# Patient Record
Sex: Female | Born: 1965 | Race: White | Hispanic: No | Marital: Married | Smoking: Current every day smoker
Health system: Southern US, Community
[De-identification: ages and names within clinical notes are randomized; demographics above are authoritative.]

## PROBLEM LIST (undated history)

## (undated) DIAGNOSIS — F419 Anxiety disorder, unspecified: Secondary | ICD-10-CM

## (undated) DIAGNOSIS — M2550 Pain in unspecified joint: Secondary | ICD-10-CM

## (undated) DIAGNOSIS — K257 Chronic gastric ulcer without hemorrhage or perforation: Secondary | ICD-10-CM

## (undated) DIAGNOSIS — R51 Headache: Secondary | ICD-10-CM

## (undated) DIAGNOSIS — F32A Depression, unspecified: Secondary | ICD-10-CM

## (undated) DIAGNOSIS — F329 Major depressive disorder, single episode, unspecified: Secondary | ICD-10-CM

## (undated) HISTORY — DX: Depression, unspecified: F32.A

## (undated) HISTORY — DX: Pain in unspecified joint: M25.50

## (undated) HISTORY — DX: Chronic gastric ulcer without hemorrhage or perforation: K25.7

## (undated) HISTORY — DX: Major depressive disorder, single episode, unspecified: F32.9

## (undated) HISTORY — DX: Headache: R51

## (undated) HISTORY — DX: Anxiety disorder, unspecified: F41.9

---

## 2006-10-08 DIAGNOSIS — M2550 Pain in unspecified joint: Secondary | ICD-10-CM

## 2006-10-08 HISTORY — DX: Pain in unspecified joint: M25.50

## 2010-08-08 DIAGNOSIS — K257 Chronic gastric ulcer without hemorrhage or perforation: Secondary | ICD-10-CM

## 2010-08-08 HISTORY — DX: Chronic gastric ulcer without hemorrhage or perforation: K25.7

## 2011-08-17 ENCOUNTER — Ambulatory Visit (INDEPENDENT_AMBULATORY_CARE_PROVIDER_SITE_OTHER): Payer: BC Managed Care – PPO | Admitting: Psychiatry

## 2011-08-17 ENCOUNTER — Encounter (HOSPITAL_COMMUNITY): Payer: Self-pay | Admitting: Psychiatry

## 2011-08-17 VITALS — BP 123/90 | HR 83 | Ht 69.0 in | Wt 234.0 lb

## 2011-08-17 DIAGNOSIS — G4700 Insomnia, unspecified: Secondary | ICD-10-CM

## 2011-08-17 DIAGNOSIS — Z6379 Other stressful life events affecting family and household: Secondary | ICD-10-CM

## 2011-08-17 DIAGNOSIS — F489 Nonpsychotic mental disorder, unspecified: Secondary | ICD-10-CM

## 2011-08-17 DIAGNOSIS — F411 Generalized anxiety disorder: Secondary | ICD-10-CM

## 2011-08-17 DIAGNOSIS — F419 Anxiety disorder, unspecified: Secondary | ICD-10-CM

## 2011-08-17 DIAGNOSIS — Z634 Disappearance and death of family member: Secondary | ICD-10-CM

## 2011-08-17 DIAGNOSIS — F5105 Insomnia due to other mental disorder: Secondary | ICD-10-CM | POA: Insufficient documentation

## 2011-08-17 DIAGNOSIS — F418 Other specified anxiety disorders: Secondary | ICD-10-CM | POA: Insufficient documentation

## 2011-08-17 DIAGNOSIS — Z636 Dependent relative needing care at home: Secondary | ICD-10-CM | POA: Insufficient documentation

## 2011-08-17 DIAGNOSIS — F341 Dysthymic disorder: Secondary | ICD-10-CM

## 2011-08-17 DIAGNOSIS — Z762 Encounter for health supervision and care of other healthy infant and child: Secondary | ICD-10-CM | POA: Insufficient documentation

## 2011-08-17 MED ORDER — CLONAZEPAM 0.5 MG PO TABS: 0.5000 mg | ORAL_TABLET | Freq: Every day | ORAL | Status: AC

## 2011-08-17 MED ORDER — CLONAZEPAM 1 MG PO TABS
1.0000 mg | ORAL_TABLET | Freq: Two times a day (BID) | ORAL | Status: DC
Start: 2011-08-27 — End: 2020-01-03

## 2011-08-17 MED ORDER — DULOXETINE HCL 30 MG PO CPEP: 30.0000 mg | ORAL_CAPSULE | Freq: Every day | ORAL | Status: DC

## 2011-08-17 NOTE — Progress Notes (Signed)
Debbie Burke is a 45 y.o. CF who is married.  Her husband is Hessie Diener, age 63 works at [employer] as Pensions consultant.,  Son Maurine Minister lives at home, works at [employer].  Daughter Noreene Larsson, unmarried with son Apolinar Junes age 75 not working.and lives at home  She worked until 2007 and lost her job.  She has osteoarthritis and a gastric ulcer.  She has taken care of her parents for the past 5 years.  Her mother just died of Alzheimer's Disease at age 49, July 2012.  Her father Maurine Minister age 752 is widowed with Hx of MI, CABG, COPD [w/o oxygen supplement] and prostate cancer  treated with radiation.  She takes care of him daily.  She is in great morning over mother's death.  She complains of grief depression and great anxiety.  She says everything falls on her shoulders and no one expresses any appreciation.  She says no one ever told her she was 'wrong' until she was married.  She is so overwhelmed that at times she goes to her mother's grave and lies down. She denies any anger outbursts, physical abuse or sexual abuse.  She denies Suicidal ideation. And has passive thoughts of wanting to be with her mother - because she misses her but not to plan to kill herself.  She is given a Crisis numbers card. She is depressed and anxious.  She asks repeatedly for more benzodiazepine.  She accepts a 0.5 mg to add to her 1 mg BID dose of clonazepam.   She is invited to consider therapy and she is hesitant.  She is asked to meet with KL at least once.  Benefits of grief therapy and anxiety reduction are presented as benefits.  She agrees to meet with KL at least  Once.  Her affect is mood congruent.  She has an orgranized logical presentation  She denies auditory/visual hallucinations.  Her cognition is intact.  She has poor insight and feels victimized.  She is also dedicated to the care of her father whom she sees daily.  Her judgment is fair. Axis I  Depression with anxiety, secondary insomnia Axis II  Dependent Personality Axis III Osteoarthritis,  headaches, gastric ulcer Axis IV family stressors, death of parent, perpetual caregiver Axis  V GAF 55

## 2011-08-17 NOTE — Patient Instructions (Addendum)
Take medications as written.  When current medication  'acid reducer' is taken   Find equivalent OTC ranitadine  Dose equivalent ADD 0.5 MG kLONOPIN TO YOUR REGIMEN  Schedule appt with KL Use Crisis numbers if needed, Card is given  Return 1 month

## 2011-09-06 ENCOUNTER — Ambulatory Visit (HOSPITAL_COMMUNITY): Payer: BC Managed Care – PPO | Admitting: Psychology

## 2011-09-17 ENCOUNTER — Ambulatory Visit (HOSPITAL_COMMUNITY): Payer: BC Managed Care – PPO | Admitting: Psychiatry

## 2011-11-02 ENCOUNTER — Other Ambulatory Visit (HOSPITAL_COMMUNITY): Payer: Self-pay | Admitting: Psychiatry

## 2011-11-02 NOTE — Progress Notes (Signed)
[facility] sent fax for prior authorization.  Multiple calls incl [employer] to confirm there is not PA required  copay is ~ $82.00

## 2012-02-29 ENCOUNTER — Encounter (HOSPITAL_COMMUNITY): Payer: Self-pay | Admitting: Psychiatry

## 2018-04-18 ENCOUNTER — Ambulatory Visit (HOSPITAL_COMMUNITY): Payer: Self-pay | Admitting: Psychiatry

## 2018-06-10 ENCOUNTER — Ambulatory Visit (HOSPITAL_COMMUNITY): Payer: Self-pay | Admitting: Psychiatry

## 2020-01-03 ENCOUNTER — Other Ambulatory Visit: Payer: Self-pay

## 2020-01-03 ENCOUNTER — Emergency Department
Admission: EM | Admit: 2020-01-03 | Discharge: 2020-01-03 | Disposition: A | Discharge status: Home / Self Care | Payer: 59 | Source: Home / Self Care | Attending: Family Medicine | Admitting: Family Medicine

## 2020-01-03 ENCOUNTER — Emergency Department (INDEPENDENT_AMBULATORY_CARE_PROVIDER_SITE_OTHER): Payer: 59

## 2020-01-03 DIAGNOSIS — M1712 Unilateral primary osteoarthritis, left knee: Secondary | ICD-10-CM | POA: Diagnosis not present

## 2020-01-03 DIAGNOSIS — M25562 Pain in left knee: Secondary | ICD-10-CM | POA: Diagnosis not present

## 2020-01-03 NOTE — ED Triage Notes (Signed)
Pt c/o LT knee pain since this morning. Was at work yesterday and it was slightly sore, but woke up today with severe pain. 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

## 2020-01-03 NOTE — ED Provider Notes (Signed)
[facility]    CSN: [id number] Arrival date & time: 01/03/20  1027      History   Chief Complaint Chief Complaint  Patient presents with  . Knee Pain    LT    HPI Debbie Burke is a 54 y.o. female.   Patient awoke with pain/swelling in her left knee this morning, and now has difficulty bearing weight on her left knee. She has a history of chronic bilateral lower leg and knee pain, followed by the [facility], but recalls no recent injury or change in physical activities.  The history is provided by the patient.  Knee Pain Location:  Knee Time since incident:  7 hours Injury: no   Knee location:  L knee Pain details:    Quality:  Aching   Radiates to:  Does not radiate   Severity:  Severe   Onset quality:  Sudden   Duration:  7 hours   Timing:  Constant   Progression:  Unchanged Chronicity:  New Prior injury to area:  No Relieved by:  Nothing Worsened by:  Bearing weight Ineffective treatments:  None tried Associated symptoms: decreased ROM, stiffness and swelling   Associated symptoms: no numbness and no tingling   Risk factors: obesity     Past Medical History:  Diagnosis Date  . Anxiety   . Arthralgia of multiple joints 2008   steoarthritis  . Depression   . Gastric ulcer, chronic November 2011   endoscopy performed  patient takes OTC  acid reducer  . [id number](478.2)     Patient Active Problem List   Diagnosis Date Noted  . Anxiety 08/17/2011  . Depression with anxiety 08/17/2011  . Bereavement 08/17/2011  . Insomnia related to another mental disorder 08/17/2011  . Caregiver burden 08/17/2011  . Child in care 08/17/2011    History reviewed. No pertinent surgical history.  OB History   No obstetric history on file.      Home Medications    Prior to Admission medications   Medication Sig Start Date End Date Taking? Authorizing Provider  gabapentin (NEURONTIN) 300 MG capsule Take 300 mg by mouth 4 (four) times  daily. 2 tabs 4x a day   Yes [provider]  hydrOXYzine (ATARAX/VISTARIL) 25 MG tablet Take 25 mg by mouth 3 (three) times daily as needed.   Yes [provider]  omeprazole (PRILOSEC) 20 MG capsule Take 20 mg by mouth daily.   Yes [provider]  tiZANidine (ZANAFLEX) 4 MG capsule Take 4 mg by mouth 3 (three) times daily. 1 qam, 1 at lunch and 3 qhs   Yes [provider]  buPROPion (WELLBUTRIN XL) 150 MG 24 hr tablet Take 300 mg by mouth daily.  05/17/11   [provider]  clonazePAM (KLONOPIN) 0.5 MG tablet Take 1 tablet (0.5 mg total) by mouth daily. 08/17/11 09/16/11  Mickeal Skinner, MD  DULoxetine (CYMBALTA) 30 MG capsule Take 1 capsule (30 mg total) by mouth daily. Patient taking differently: Take 60 mg by mouth daily.  08/17/11 08/16/12  Mickeal Skinner, MD  HYDROcodone-acetaminophen (VICODIN) 5-500 MG per tablet Take 5 tablets by mouth 3 (three) times daily. Actual dose is 5 mg-50 given by PCP 07/17/11   [provider]    Family History Family History  Problem Relation Age of Onset  . Depression Father   . Dementia Father   . Bipolar disorder Daughter     Social History Social History   Tobacco Use  . Smoking  status: Current Every Day Smoker    Packs/day: 1.00    Types: Cigarettes  . Smokeless tobacco: Never Used  . Tobacco comment: has Chantix Rx and not using it  Substance Use Topics  . Alcohol use: No  . Drug use: No     Allergies   Patient has no known allergies.   Review of Systems Review of Systems  Constitutional: Negative.   HENT: Negative.   Eyes: Negative.   Respiratory: Negative for chest tightness, shortness of breath, wheezing and stridor.   Cardiovascular: Negative for chest pain.  Gastrointestinal: Negative.   Genitourinary: Negative.   Musculoskeletal: Positive for arthralgias, gait problem, joint swelling and stiffness.  Skin: Negative.      Physical Exam Triage Vital Signs ED Triage  Vitals  Enc Vitals Group     BP 01/03/20 1043 108/71     Pulse Rate 01/03/20 1043 75     Resp --      Temp 01/03/20 1043 98 F (36.7 C)     Temp Source 01/03/20 1043 Oral     SpO2 01/03/20 1043 96 %     Weight --      Height --      Head Circumference --      Peak Flow --      Pain Score 01/03/20 1046 7     Pain Loc --      Pain Edu? --      Excl. in GC? --    No data found.  Updated Vital Signs BP 108/71 (BP Location: Left Arm)   Pulse 75   Temp 98 F (36.7 C) (Oral)   LMP 10/16/2010   SpO2 96%   Visual Acuity Right Eye Distance:   Left Eye Distance:   Bilateral Distance:    Right Eye Near:   Left Eye Near:    Bilateral Near:     Physical Exam Vitals and nursing note reviewed.  Constitutional:      General: She is not in acute distress. HENT:     Head: Normocephalic.  Eyes:     Pupils: Pupils are equal, round, and reactive to light.  Cardiovascular:     Rate and Rhythm: Normal rate.  Pulmonary:     Effort: Pulmonary effort is normal.  Musculoskeletal:     Cervical back: Normal range of motion.     Left knee: Bony tenderness present. No swelling, erythema, ecchymosis or crepitus. Decreased range of motion. Tenderness present over the lateral joint line. No ACL laxity or PCL laxity.Normal meniscus.       Legs:     Comments: Left knee:  Patient has difficulty actively or passively flexing beyond 90 degrees.  Tenderness over lateral joint line and lateral edge of patella. No calf or thigh tenderness.  Skin:    General: Skin is warm and dry.  Neurological:     Mental Status: She is alert.      UC Treatments / Results  Labs (all labs ordered are listed, but only abnormal results are displayed) Labs Reviewed - No data to display  EKG   Radiology DG Knee Complete 4 Views Left  Result Date: 01/03/2020 CLINICAL DATA:  Increasing left knee pain. EXAM: LEFT KNEE - COMPLETE 4+ VIEW COMPARISON:  None. FINDINGS: No acute fracture or dislocation.  Generalized osteopenia. No aggressive osseous lesion. Trace joint effusion. Mild tricompartmental osteoarthritis of the left knee most severe in the medial femorotibial compartment and patellofemoral compartment. IMPRESSION: No acute osseous injury of the left  knee. Mild tricompartmental osteoarthritis of the left knee. Electronically Signed   By: Kathreen Devoid   On: 01/03/2020 11:53    Procedures Procedures (including critical care time)  Medications Ordered in UC Medications - No data to display  Initial Impression / Assessment and Plan / UC Course  I have reviewed the triage vital signs and the nursing notes.  Pertinent labs & imaging results that were available during my care of the patient were reviewed by me and considered in my medical decision making (see chart for details).    Dispensed knee brace. Followup with Dr. Aundria Mems ([facility]) for further evaluation/treatment.  Final Clinical Impressions(s) / UC Diagnoses   Final diagnoses:  Acute pain of left knee  Primary osteoarthritis of left knee     Discharge Instructions     Wear knee brace.  Use cane for support.  Apply ice pack for 20 to 30 minutes, 3 to 4 times daily  Continue until pain and swelling decrease.  May take Ibuprofen 200mg , 4 tabs every 8 hours with food.     ED Prescriptions    None        Kandra Nicolas, MD 01/03/20 1214

## 2020-01-03 NOTE — Discharge Instructions (Addendum)
Wear knee brace.  Use cane for support.  Apply ice pack for 20 to 30 minutes, 3 to 4 times daily  Continue until pain and swelling decrease.  May take Ibuprofen 200mg , 4 tabs every 8 hours with food.

## 2021-09-25 IMAGING — DX DG KNEE COMPLETE 4+V*L*
4 series · 4 of 4 positions shown · non-contrast
Comparison: None.

CLINICAL DATA: Increasing left knee pain.

EXAM:
LEFT KNEE - COMPLETE 4+ VIEW

[knee ap]
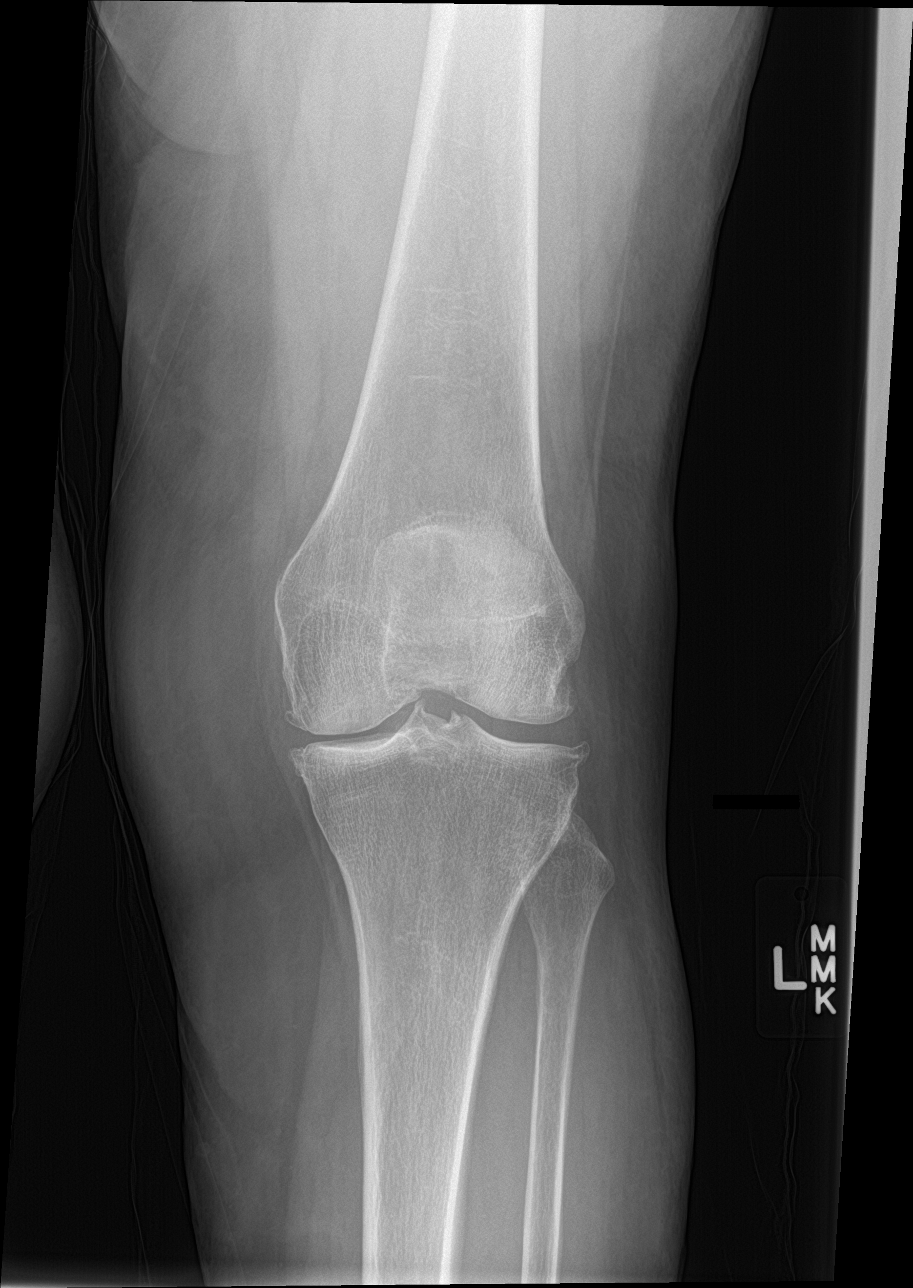

[tunnel]
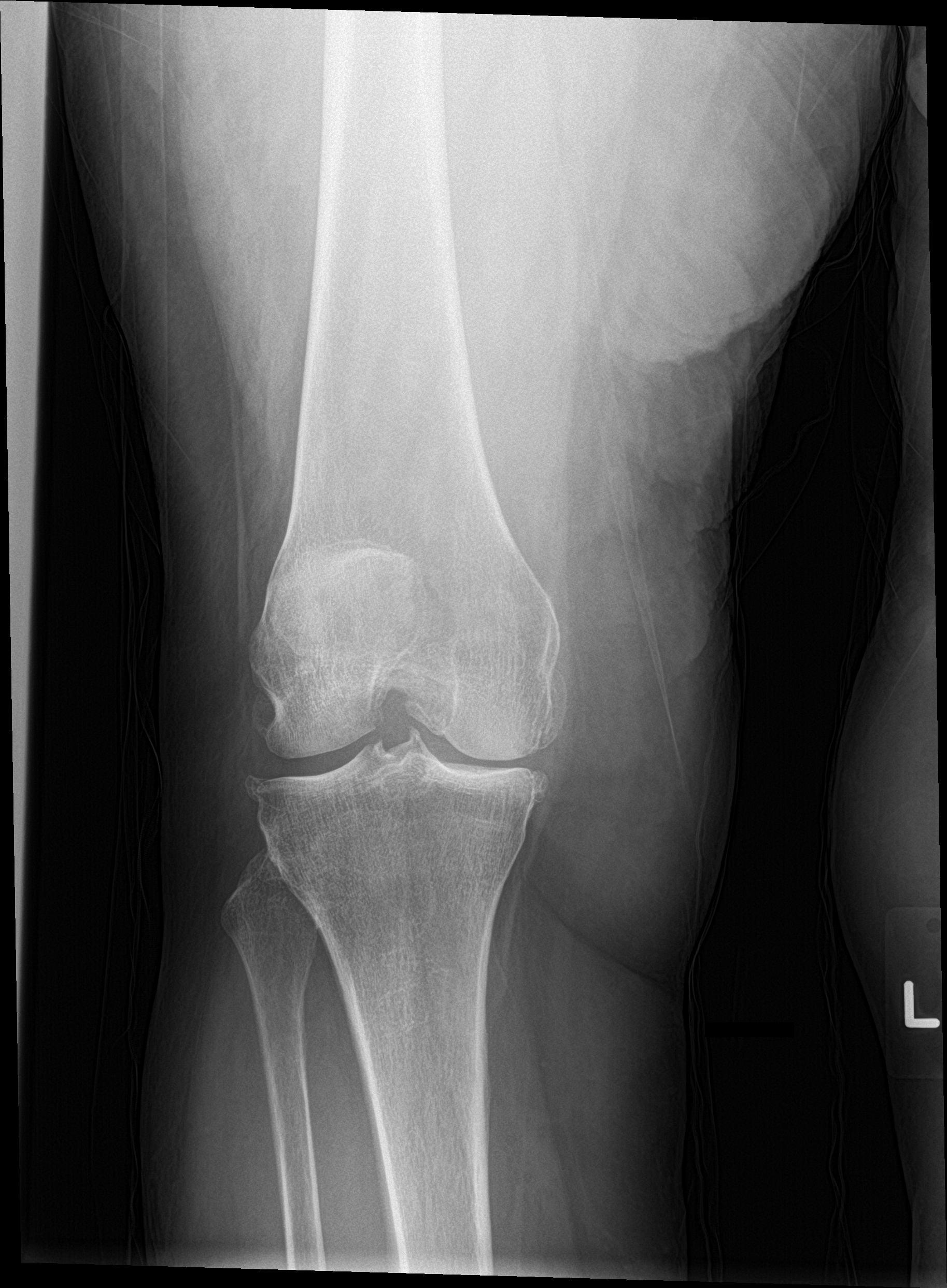

[knee lat]
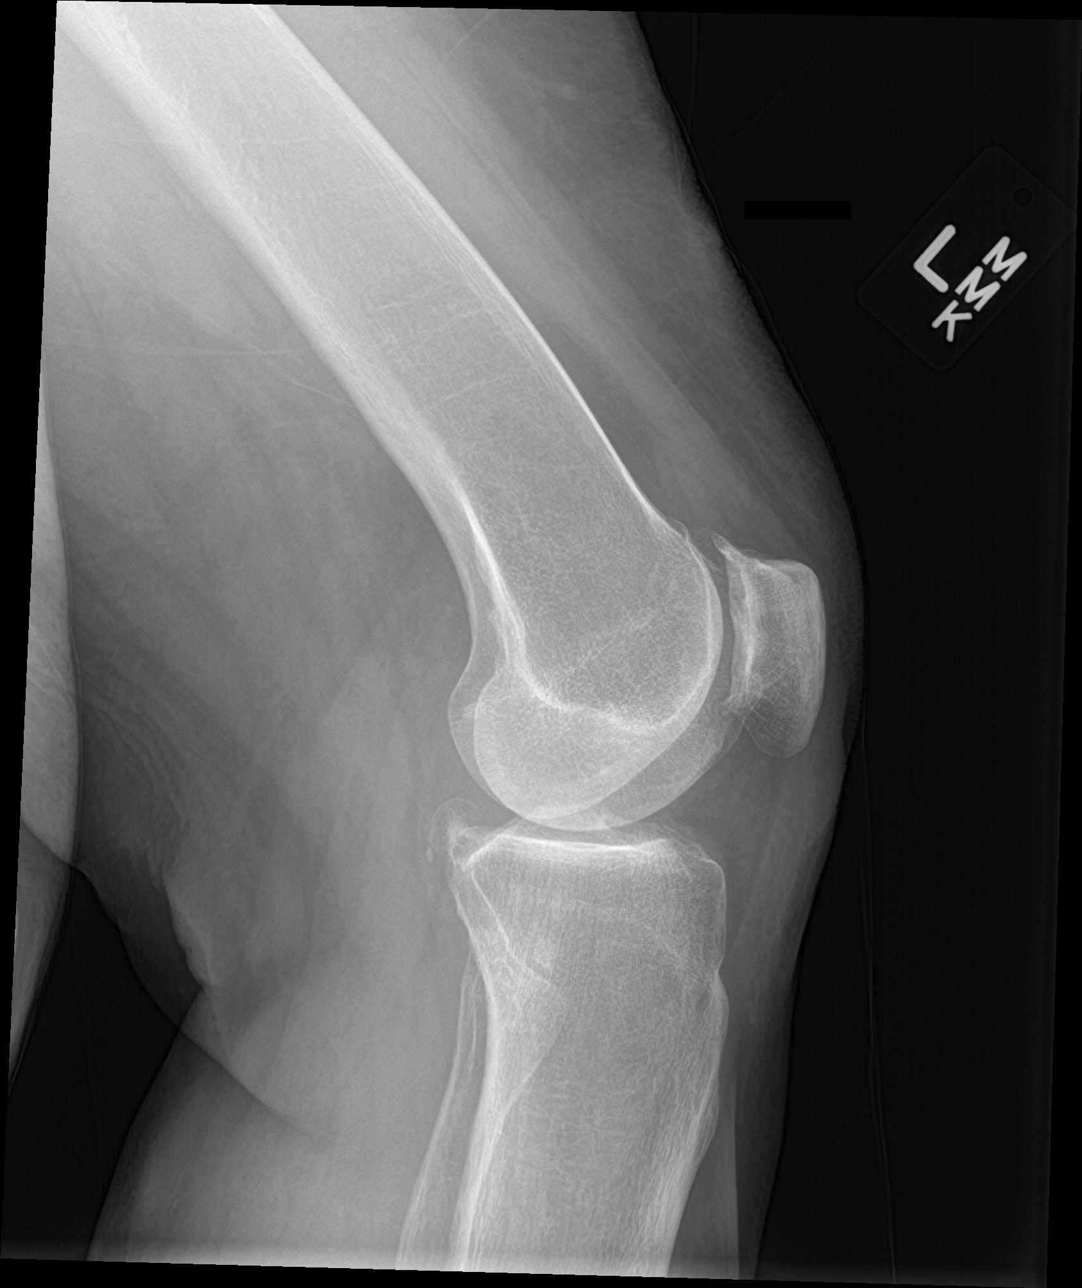

[knee sunrise]
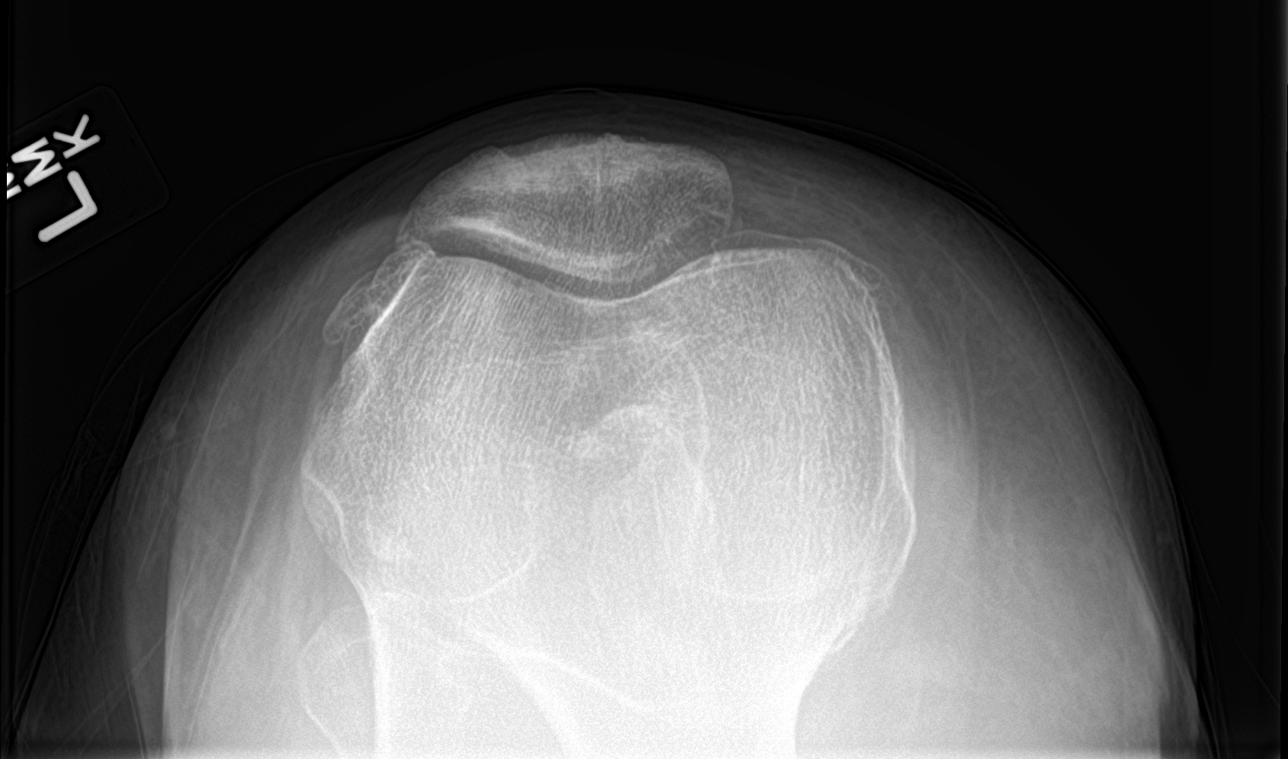

[4 of 4 positions shown; findings below may reference images not displayed]

FINDINGS: No acute fracture or dislocation. Generalized osteopenia. No
aggressive osseous lesion. Trace joint effusion. Mild
tricompartmental osteoarthritis of the left knee most severe in the
medial femorotibial compartment and patellofemoral compartment.
IMPRESSION: No acute osseous injury of the left knee.

Mild tricompartmental osteoarthritis of the left knee.

## 2022-09-11 DIAGNOSIS — Z23 Encounter for immunization: Secondary | ICD-10-CM | POA: Diagnosis not present

## 2022-09-11 DIAGNOSIS — Z6831 Body mass index (BMI) 31.0-31.9, adult: Secondary | ICD-10-CM | POA: Diagnosis not present

## 2022-09-11 DIAGNOSIS — J0100 Acute maxillary sinusitis, unspecified: Secondary | ICD-10-CM | POA: Diagnosis not present

## 2022-09-11 DIAGNOSIS — Z0000 Encounter for general adult medical examination without abnormal findings: Secondary | ICD-10-CM | POA: Diagnosis not present

## 2022-09-18 DIAGNOSIS — T402X5A Adverse effect of other opioids, initial encounter: Secondary | ICD-10-CM | POA: Diagnosis not present

## 2022-09-18 DIAGNOSIS — K5903 Drug induced constipation: Secondary | ICD-10-CM | POA: Diagnosis not present

## 2022-09-18 DIAGNOSIS — Z1211 Encounter for screening for malignant neoplasm of colon: Secondary | ICD-10-CM | POA: Diagnosis not present

## 2022-09-18 DIAGNOSIS — K315 Obstruction of duodenum: Secondary | ICD-10-CM | POA: Diagnosis not present

## 2022-09-18 DIAGNOSIS — R1013 Epigastric pain: Secondary | ICD-10-CM | POA: Diagnosis not present

## 2022-09-18 DIAGNOSIS — Z9049 Acquired absence of other specified parts of digestive tract: Secondary | ICD-10-CM | POA: Diagnosis not present

## 2022-09-18 DIAGNOSIS — R112 Nausea with vomiting, unspecified: Secondary | ICD-10-CM | POA: Diagnosis not present

## 2022-09-18 DIAGNOSIS — K5900 Constipation, unspecified: Secondary | ICD-10-CM | POA: Diagnosis not present

## 2022-11-29 DIAGNOSIS — J0101 Acute recurrent maxillary sinusitis: Secondary | ICD-10-CM | POA: Diagnosis not present

## 2022-12-08 DIAGNOSIS — R051 Acute cough: Secondary | ICD-10-CM | POA: Diagnosis not present

## 2022-12-08 DIAGNOSIS — R918 Other nonspecific abnormal finding of lung field: Secondary | ICD-10-CM | POA: Diagnosis not present

## 2022-12-08 DIAGNOSIS — R059 Cough, unspecified: Secondary | ICD-10-CM | POA: Diagnosis not present

## 2022-12-12 DIAGNOSIS — M5440 Lumbago with sciatica, unspecified side: Secondary | ICD-10-CM | POA: Diagnosis not present

## 2022-12-12 DIAGNOSIS — M25562 Pain in left knee: Secondary | ICD-10-CM | POA: Diagnosis not present

## 2022-12-12 DIAGNOSIS — G609 Hereditary and idiopathic neuropathy, unspecified: Secondary | ICD-10-CM | POA: Diagnosis not present

## 2022-12-12 DIAGNOSIS — G8929 Other chronic pain: Secondary | ICD-10-CM | POA: Diagnosis not present

## 2022-12-12 DIAGNOSIS — M797 Fibromyalgia: Secondary | ICD-10-CM | POA: Diagnosis not present

## 2022-12-12 DIAGNOSIS — M25561 Pain in right knee: Secondary | ICD-10-CM | POA: Diagnosis not present

## 2023-02-04 DIAGNOSIS — J479 Bronchiectasis, uncomplicated: Secondary | ICD-10-CM | POA: Diagnosis not present

## 2023-02-12 DIAGNOSIS — G43C1 Periodic headache syndromes in child or adult, intractable: Secondary | ICD-10-CM | POA: Diagnosis not present

## 2023-02-12 DIAGNOSIS — F419 Anxiety disorder, unspecified: Secondary | ICD-10-CM | POA: Diagnosis not present

## 2023-02-12 DIAGNOSIS — J8410 Pulmonary fibrosis, unspecified: Secondary | ICD-10-CM | POA: Diagnosis not present

## 2023-02-12 DIAGNOSIS — J432 Centrilobular emphysema: Secondary | ICD-10-CM | POA: Diagnosis not present

## 2023-02-12 DIAGNOSIS — J189 Pneumonia, unspecified organism: Secondary | ICD-10-CM | POA: Diagnosis not present

## 2023-02-12 DIAGNOSIS — F3340 Major depressive disorder, recurrent, in remission, unspecified: Secondary | ICD-10-CM | POA: Diagnosis not present

## 2023-02-26 DIAGNOSIS — M797 Fibromyalgia: Secondary | ICD-10-CM | POA: Diagnosis not present

## 2023-02-26 DIAGNOSIS — Z5181 Encounter for therapeutic drug level monitoring: Secondary | ICD-10-CM | POA: Diagnosis not present

## 2023-02-26 DIAGNOSIS — Z79899 Other long term (current) drug therapy: Secondary | ICD-10-CM | POA: Diagnosis not present

## 2023-02-26 DIAGNOSIS — M5440 Lumbago with sciatica, unspecified side: Secondary | ICD-10-CM | POA: Diagnosis not present

## 2023-02-26 DIAGNOSIS — G609 Hereditary and idiopathic neuropathy, unspecified: Secondary | ICD-10-CM | POA: Diagnosis not present

## 2023-02-26 DIAGNOSIS — M25561 Pain in right knee: Secondary | ICD-10-CM | POA: Diagnosis not present

## 2023-02-26 DIAGNOSIS — M25562 Pain in left knee: Secondary | ICD-10-CM | POA: Diagnosis not present

## 2023-02-26 DIAGNOSIS — G8929 Other chronic pain: Secondary | ICD-10-CM | POA: Diagnosis not present

## 2023-04-01 DIAGNOSIS — R911 Solitary pulmonary nodule: Secondary | ICD-10-CM | POA: Diagnosis not present

## 2023-04-01 DIAGNOSIS — J8410 Pulmonary fibrosis, unspecified: Secondary | ICD-10-CM | POA: Diagnosis not present

## 2023-04-01 DIAGNOSIS — J479 Bronchiectasis, uncomplicated: Secondary | ICD-10-CM | POA: Diagnosis not present

## 2023-04-01 DIAGNOSIS — I2510 Atherosclerotic heart disease of native coronary artery without angina pectoris: Secondary | ICD-10-CM | POA: Diagnosis not present

## 2023-04-14 ENCOUNTER — Ambulatory Visit
Admission: EM | Admit: 2023-04-14 | Discharge: 2023-04-14 | Disposition: A | Discharge status: Home / Self Care | Payer: Medicaid Other | Attending: Family Medicine | Admitting: Family Medicine

## 2023-04-14 ENCOUNTER — Other Ambulatory Visit: Payer: Self-pay

## 2023-04-14 ENCOUNTER — Encounter: Payer: Self-pay | Admitting: Emergency Medicine

## 2023-04-14 ENCOUNTER — Ambulatory Visit (INDEPENDENT_AMBULATORY_CARE_PROVIDER_SITE_OTHER): Payer: Medicaid Other

## 2023-04-14 DIAGNOSIS — R509 Fever, unspecified: Secondary | ICD-10-CM | POA: Diagnosis not present

## 2023-04-14 DIAGNOSIS — R058 Other specified cough: Secondary | ICD-10-CM

## 2023-04-14 DIAGNOSIS — Z8709 Personal history of other diseases of the respiratory system: Secondary | ICD-10-CM

## 2023-04-14 DIAGNOSIS — J209 Acute bronchitis, unspecified: Secondary | ICD-10-CM

## 2023-04-14 DIAGNOSIS — R0989 Other specified symptoms and signs involving the circulatory and respiratory systems: Secondary | ICD-10-CM | POA: Diagnosis not present

## 2023-04-14 DIAGNOSIS — R059 Cough, unspecified: Secondary | ICD-10-CM | POA: Diagnosis not present

## 2023-04-14 MED ORDER — AMOXICILLIN-POT CLAVULANATE 875-125 MG PO TABS: 1.0000 | ORAL_TABLET | Freq: Two times a day (BID) | ORAL | 0 refills | Status: DC

## 2023-04-14 MED ORDER — PREDNISONE 20 MG PO TABS: 40.0000 mg | ORAL_TABLET | Freq: Every day | ORAL | 0 refills | Status: DC

## 2023-04-14 NOTE — Discharge Instructions (Signed)
Drink lots of water Take the antibiotic 2 x a day Take with food Tale the prednisone once a day Use a humidifier int he bedroom if one is available Continue inhalers Call pulmonary doctor if not improving in a couple of days

## 2023-04-14 NOTE — ED Triage Notes (Signed)
Pt here for nasal/chest congestion and headache over frontal sinuses.  + yellow sputum with cough.  Pt reports temp 100 last night.  C/o pain over maxillary sinuses as well and states "I just don't feel good".  Did home covid test yesterday that was negative.  Pt reports recent pulmonary fibrosis dx.  Feels like has wheezing with exertion.

## 2023-04-14 NOTE — ED Provider Notes (Signed)
[facility]    CSN: [id number] Arrival date & time: 04/14/23  1004      History   Chief Complaint Chief Complaint  Patient presents with   Cough   Nasal Congestion         HPI Debbie Burke is a 57 y.o. female.   HPI Patient has an upper respiratory infection with cough and chest congestion has been going on for the last 3 to 4 days.  Her temp was 100 last night.  She does have underlying pulmonary fibrosis.  She states that she did a COVID test at home that was negative.  She states that she feels like she is wheezing.  She feels short of breath.  She is using her inhalers.  She states that when she gets respiratory infections she usually sees her pulmonary doctors.  They are not available today on Sunday  Past Medical History:  Diagnosis Date   Anxiety    Arthralgia of multiple joints 2008   steoarthritis   Depression    Gastric ulcer, chronic November 2011   endoscopy performed  patient takes OTC  acid reducer   Headache(784.0)     Patient Active Problem List   Diagnosis Date Noted   Anxiety 08/17/2011   Depression with anxiety 08/17/2011   Bereavement 08/17/2011   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 No pertinent surgical history.  OB History   No obstetric history on file.      Home Medications    Prior to Admission medications   Medication Sig Start Date End Date Taking? Authorizing Provider  amoxicillin-clavulanate (AUGMENTIN) 875-125 MG tablet Take 1 tablet by mouth every 12 (twelve) hours. 04/14/23  Yes Eustace Moore, MD  oxyCODONE-acetaminophen (PERCOCET) 7.5-325 MG tablet Take 1 tablet by mouth every 6 (six) hours as needed for severe pain.   Yes [provider]  predniSONE (DELTASONE) 20 MG tablet Take 2 tablets (40 mg total) by mouth daily with breakfast. 04/14/23  Yes Eustace Moore, MD  buPROPion (WELLBUTRIN XL) 150 MG  24 hr tablet Take 300 mg by mouth daily.  05/17/11   [provider]  clonazePAM (KLONOPIN) 0.5 MG tablet Take 1 tablet (0.5 mg total) by mouth daily. 08/17/11 09/16/11  Mickeal Skinner, MD  DULoxetine (CYMBALTA) 30 MG capsule Take 1 capsule (30 mg total) by mouth daily. Patient taking differently: Take 60 mg by mouth daily.  08/17/11 08/16/12  Mickeal Skinner, MD  gabapentin (NEURONTIN) 300 MG capsule Take 300 mg by mouth 4 (four) times daily. 2 tabs 4x a day    [provider]  hydrOXYzine (ATARAX/VISTARIL) 25 MG tablet Take 25 mg by mouth 3 (three) times daily as needed.    [provider]  omeprazole (PRILOSEC) 20 MG capsule Take 20 mg by mouth daily.    [provider]  tiZANidine (ZANAFLEX) 4 MG capsule Take 4 mg by mouth 3 (three) times daily. 1 qam, 1 at lunch and 3 qhs    [provider]    Family History Family History  Problem Relation Age of Onset   Depression Father    Dementia Father    Bipolar disorder Daughter     Social History Social History   Tobacco Use   Smoking status: Every Day    Packs/day: 1    Types: Cigarettes   Smokeless tobacco: Never   Tobacco comments:  has Chantix Rx and not using it  Vaping Use   Vaping Use: Never used  Substance Use Topics   Alcohol use: No   Drug use: No     Allergies   Patient has no known allergies.   Review of Systems Review of Systems  See HPI Physical Exam Triage Vital Signs ED Triage Vitals  Enc Vitals Group     BP 04/14/23 1012 120/85     Pulse Rate 04/14/23 1012 94     Resp 04/14/23 1012 (!) 24     Temp 04/14/23 1012 98.3 F (36.8 C)     Temp Source 04/14/23 1012 Oral     SpO2 04/14/23 1012 98 %     Weight 04/14/23 1014 205 lb (93 kg)     Height 04/14/23 1014 5\' 9"  (1.753 m)     Head Circumference --      Peak Flow --      Pain Score 04/14/23 1012 7     Pain Loc --      Pain Edu? --      Excl. in GC? --    No data found.  Updated Vital Signs BP 120/85    Pulse 94   Temp 98.3 F (36.8 C) (Oral)   Resp (!) 24   Ht 5\' 9"  (1.753 m)   Wt 93 kg   LMP 10/16/2010   SpO2 98%   BMI 30.27 kg/m       Physical Exam Constitutional:      General: She is not in acute distress.    Appearance: She is well-developed. She is ill-appearing.  HENT:     Head: Normocephalic and atraumatic.  Eyes:     Conjunctiva/sclera: Conjunctivae normal.     Pupils: Pupils are equal, round, and reactive to light.  Cardiovascular:     Rate and Rhythm: Normal rate.     Heart sounds: Normal heart sounds.  Pulmonary:     Effort: Pulmonary effort is normal. No respiratory distress.     Breath sounds: Wheezing and rhonchi present.     Comments: Harsh rhonchi and wheeze throughout both lungs.  Mildly dyspneic Abdominal:     General: There is no distension.     Palpations: Abdomen is soft.  Musculoskeletal:        General: Normal range of motion.     Cervical back: Normal range of motion.  Skin:    General: Skin is warm and dry.  Neurological:     Mental Status: She is alert.      UC Treatments / Results  Labs (all labs ordered are listed, but only abnormal results are displayed) Labs Reviewed - No data to display  EKG   Radiology DG Chest 2 View  Result Date: 04/14/2023 CLINICAL DATA:  Cough, congestion and fever. History of pulmonary fibrosis. EXAM: CHEST - 2 VIEW COMPARISON:  None Available. FINDINGS: Cardiac silhouette is normal in size. Normal mediastinal and hilar contours. Bilateral interstitial thickening consistent with the given history of fibrosis. No lung consolidation or convincing edema. No pleural effusion or pneumothorax. Skeletal structures are intact. IMPRESSION: No acute cardiopulmonary disease. Electronically Signed   By: Amie Portland M.D.   On: 04/14/2023 11:09    Procedures Procedures (including critical care time)  Medications Ordered in UC Medications - No data to display  Initial Impression / Assessment and Plan / UC  Course  I have reviewed the triage vital signs and the nursing notes.  Pertinent labs & imaging results that  were available during my care of the patient were reviewed by me and considered in my medical decision making (see chart for details).     Final Clinical Impressions(s) / UC Diagnoses   Final diagnoses:  Acute bronchitis, unspecified organism  History of pulmonary fibrosis     Discharge Instructions      Drink lots of water Take the antibiotic 2 x a day Take with food Tale the prednisone once a day Use a humidifier int he bedroom if one is available Continue inhalers Call pulmonary doctor if not improving in a couple of days   ED Prescriptions     Medication Sig Dispense Auth. Provider   predniSONE (DELTASONE) 20 MG tablet Take 2 tablets (40 mg total) by mouth daily with breakfast. 10 tablet Eustace Moore, MD   amoxicillin-clavulanate (AUGMENTIN) 875-125 MG tablet Take 1 tablet by mouth every 12 (twelve) hours. 14 tablet Eustace Moore, MD      I have reviewed the PDMP during this encounter.   Eustace Moore, MD 04/14/23 1556

## 2023-04-29 DIAGNOSIS — J189 Pneumonia, unspecified organism: Secondary | ICD-10-CM | POA: Diagnosis not present

## 2023-04-29 DIAGNOSIS — J8410 Pulmonary fibrosis, unspecified: Secondary | ICD-10-CM | POA: Diagnosis not present

## 2023-05-05 DIAGNOSIS — J479 Bronchiectasis, uncomplicated: Secondary | ICD-10-CM | POA: Diagnosis not present

## 2023-05-22 DIAGNOSIS — J432 Centrilobular emphysema: Secondary | ICD-10-CM | POA: Diagnosis not present

## 2023-05-22 DIAGNOSIS — J189 Pneumonia, unspecified organism: Secondary | ICD-10-CM | POA: Diagnosis not present

## 2023-06-05 DIAGNOSIS — J479 Bronchiectasis, uncomplicated: Secondary | ICD-10-CM | POA: Diagnosis not present

## 2023-06-20 ENCOUNTER — Ambulatory Visit
Admission: EM | Admit: 2023-06-20 | Discharge: 2023-06-20 | Disposition: A | Discharge status: Home / Self Care | Payer: Medicaid Other | Attending: Family Medicine | Admitting: Family Medicine

## 2023-06-20 DIAGNOSIS — R300 Dysuria: Secondary | ICD-10-CM | POA: Diagnosis not present

## 2023-06-20 DIAGNOSIS — E860 Dehydration: Secondary | ICD-10-CM | POA: Insufficient documentation

## 2023-06-20 DIAGNOSIS — N3000 Acute cystitis without hematuria: Secondary | ICD-10-CM | POA: Insufficient documentation

## 2023-06-20 LAB — POCT URINALYSIS DIP (MANUAL ENTRY)
Bilirubin, UA: NEGATIVE
Blood, UA: NEGATIVE
Glucose, UA: NEGATIVE mg/dL
Ketones, POC UA: NEGATIVE mg/dL
Leukocytes, UA: NEGATIVE
Nitrite, UA: POSITIVE — AB
Protein Ur, POC: 30 mg/dL — AB
Spec Grav, UA: >=1.03 — AB (ref 1.010–1.025)
Urobilinogen, UA: 0.2 U/dL
pH, UA: 6 (ref 5.0–8.0)

## 2023-06-20 MED ORDER — SULFAMETHOXAZOLE-TRIMETHOPRIM 800-160 MG PO TABS: 1.0000 | ORAL_TABLET | Freq: Two times a day (BID) | ORAL | 0 refills | Status: AC

## 2023-06-20 NOTE — ED Provider Notes (Signed)
[facility]    CSN: [id number] Arrival date & time: 06/20/23  0817      History   Chief Complaint Chief Complaint  Patient presents with   Dysuria   Back Pain    HPI Debbie Burke is a 57 y.o. female.   HPI Pleasant 58 year old female presents with urinary frequency and back pain for 3 days.  Reports her home UTI test was positive PMH significant for obesity, arthralgia of multiple joints, and chronic gastric ulcer.  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 No pertinent surgical history.  OB History   No obstetric history on file.      Home Medications    Prior to Admission medications   Medication Sig Start Date End Date Taking? Authorizing Provider  buPROPion (WELLBUTRIN XL) 150 MG 24 hr tablet Take 300 mg by mouth daily.  05/17/11   [provider]  clonazePAM (KLONOPIN) 0.5 MG tablet Take 1 tablet (0.5 mg total) by mouth daily. 08/17/11 09/16/11  Mickeal Skinner, MD  gabapentin (NEURONTIN) 300 MG capsule Take 300 mg by mouth 4 (four) times daily. 2 tabs 4x a day    [provider]  hydrOXYzine (ATARAX/VISTARIL) 25 MG tablet Take 25 mg by mouth 3 (three) times daily as needed.    [provider]  omeprazole (PRILOSEC) 20 MG capsule Take 20 mg by mouth daily.    [provider]  oxyCODONE-acetaminophen (PERCOCET) 7.5-325 MG tablet Take 1 tablet by mouth every 6 (six) hours as needed for severe pain.    [provider]  sulfamethoxazole-trimethoprim  (BACTRIM DS) 800-160 MG tablet Take 1 tablet by mouth 2 (two) times daily for 7 days. 06/20/23 06/27/23 Yes Trevor Iha, FNP  tiZANidine (ZANAFLEX) 4 MG capsule Take 4 mg by mouth 3 (three) times daily. 1 qam, 1 at lunch and 3 qhs    [provider]    Family History Family History  Problem Relation Age of Onset   Depression Father    Dementia Father    Bipolar disorder Daughter     Social History Social History   Tobacco Use   Smoking status: Every Day    Current packs/day: 1.00    Types: Cigarettes   Smokeless tobacco: Never   Tobacco comments:    has Chantix Rx and not using it  Vaping Use   Vaping status: Never Used  Substance Use Topics   Alcohol use: No   Drug use: No     Allergies   Patient has no known allergies.   Review of Systems Review of Systems  Genitourinary:  Positive for dysuria and frequency.     Physical Exam Triage Vital Signs ED Triage Vitals  Encounter Vitals Group     BP      Systolic BP Percentile      Diastolic BP Percentile      Pulse      Resp      Temp  Temp src      SpO2      Weight      Height      Head Circumference      Peak Flow      Pain Score      Pain Loc      Pain Education      Exclude from Growth Chart    No data found.  Updated Vital Signs BP 116/77 (BP Location: Right Arm)   Pulse 91   Temp 97.7 F (36.5 C) (Oral)   Resp 16   LMP 10/16/2010   SpO2 97%    Physical Exam Vitals and nursing note reviewed.  Constitutional:      Appearance: Normal appearance. She is normal weight.  HENT:     Head: Normocephalic and atraumatic.     Mouth/Throat:     Mouth: Mucous membranes are moist.     Pharynx: Oropharynx is clear.  Eyes:     Extraocular Movements: Extraocular movements intact.     Conjunctiva/sclera: Conjunctivae normal.     Pupils: Pupils are equal, round, and reactive to light.  Cardiovascular:     Rate and Rhythm: Normal rate and regular rhythm.     Pulses: Normal pulses.      Heart sounds: Normal heart sounds.  Pulmonary:     Effort: Pulmonary effort is normal.     Breath sounds: Normal breath sounds. No wheezing, rhonchi or rales.  Abdominal:     Tenderness: There is no right CVA tenderness or left CVA tenderness.  Musculoskeletal:        General: Normal range of motion.     Cervical back: Normal range of motion and neck supple.  Skin:    General: Skin is warm and dry.  Neurological:     General: No focal deficit present.     Mental Status: She is alert and oriented to person, place, and time. Mental status is at baseline.  Psychiatric:        Mood and Affect: Mood normal.        Behavior: Behavior normal.        Thought Content: Thought content normal.      UC Treatments / Results  Labs (all labs ordered are listed, but only abnormal results are displayed) Labs Reviewed  POCT URINALYSIS DIP (MANUAL ENTRY) - Abnormal; Notable for the following components:      Result Value   Spec Grav, UA >=1.030 (*)    Protein Ur, POC =30 (*)    Nitrite, UA Positive (*)    All other components within normal limits  URINE CULTURE    EKG   Radiology No results found.  Procedures Procedures (including critical care time)  Medications Ordered in UC Medications - No data to display  Initial Impression / Assessment and Plan / UC Course  I have reviewed the triage vital signs and the nursing notes.  Pertinent labs & imaging results that were available during my care of the patient were reviewed by me and considered in my medical decision making (see chart for details).     MDM: 1.  Acute cystitis without hematuria-UA revealed above, Rx'd Bactrim DS 800/160 mg tablet: Take 1 tablet twice daily x 7 days; 2.  Dysuria-UA revealed above, urine culture ordered, same as 1 Rx'd Bactrim DS 800/160 mg tablet: Take 1 tablet twice daily x 7 days; 3.  Dehydration-specific gravity revealed above, patient notified encouraged to increase daily water intake to 64 ounces  per day  7 days/week. Advised patient to take medication as directed with food to completion.  Encouraged to increase daily water intake to 64 ounces per day while taking this medication.  Advised we will follow-up with urine culture results once received.  Advised if symptoms worsen and/or unresolved please follow-up PCP or here for further evaluation.  Patient discharged home, hemodynamically stable. Final Clinical Impressions(s) / UC Diagnoses   Final diagnoses:  Dysuria  Acute cystitis without hematuria  Dehydration     Discharge Instructions      Advised patient to take medication as directed with food to completion.  Encouraged to increase daily water intake to 64 ounces per day while taking this medication.  Advised we will follow-up with urine culture results once received.  Advised if symptoms worsen and/or unresolved please follow-up PCP or here for further evaluation.     ED Prescriptions     Medication Sig Dispense Auth. Provider   sulfamethoxazole-trimethoprim (BACTRIM DS) 800-160 MG tablet Take 1 tablet by mouth 2 (two) times daily for 7 days. 14 tablet Trevor Iha, FNP      PDMP not reviewed this encounter.   Trevor Iha, FNP 06/20/23 0900

## 2023-06-20 NOTE — Discharge Instructions (Addendum)
Advised patient to take medication as directed with food to completion.  Encouraged to increase daily water intake to 64 ounces per day while taking this medication.  Advised we will follow-up with urine culture results once received.  Advised if symptoms worsen and/or unresolved please follow-up PCP or here for further evaluation.

## 2023-06-20 NOTE — ED Triage Notes (Signed)
Pt c/o urinary frequency and back pain x 3 days. At home test pos for UTI.

## 2023-06-22 LAB — URINE CULTURE: Culture: >=100000 — AB

## 2023-08-13 DIAGNOSIS — M25562 Pain in left knee: Secondary | ICD-10-CM | POA: Diagnosis not present

## 2023-08-13 DIAGNOSIS — M25561 Pain in right knee: Secondary | ICD-10-CM | POA: Diagnosis not present

## 2023-08-13 DIAGNOSIS — M797 Fibromyalgia: Secondary | ICD-10-CM | POA: Diagnosis not present

## 2023-08-13 DIAGNOSIS — M5440 Lumbago with sciatica, unspecified side: Secondary | ICD-10-CM | POA: Diagnosis not present

## 2023-08-13 DIAGNOSIS — Z5181 Encounter for therapeutic drug level monitoring: Secondary | ICD-10-CM | POA: Diagnosis not present

## 2023-08-13 DIAGNOSIS — Z79899 Other long term (current) drug therapy: Secondary | ICD-10-CM | POA: Diagnosis not present

## 2023-08-20 ENCOUNTER — Ambulatory Visit
Admission: EM | Admit: 2023-08-20 | Discharge: 2023-08-20 | Disposition: A | Discharge status: Home / Self Care | Payer: Medicaid Other | Attending: Family Medicine | Admitting: Family Medicine

## 2023-08-20 DIAGNOSIS — F17210 Nicotine dependence, cigarettes, uncomplicated: Secondary | ICD-10-CM | POA: Insufficient documentation

## 2023-08-20 DIAGNOSIS — R059 Cough, unspecified: Secondary | ICD-10-CM | POA: Insufficient documentation

## 2023-08-20 DIAGNOSIS — J8410 Pulmonary fibrosis, unspecified: Secondary | ICD-10-CM | POA: Insufficient documentation

## 2023-08-20 DIAGNOSIS — F419 Anxiety disorder, unspecified: Secondary | ICD-10-CM | POA: Diagnosis not present

## 2023-08-20 DIAGNOSIS — N3000 Acute cystitis without hematuria: Secondary | ICD-10-CM | POA: Insufficient documentation

## 2023-08-20 DIAGNOSIS — F32A Depression, unspecified: Secondary | ICD-10-CM | POA: Insufficient documentation

## 2023-08-20 DIAGNOSIS — K257 Chronic gastric ulcer without hemorrhage or perforation: Secondary | ICD-10-CM | POA: Insufficient documentation

## 2023-08-20 DIAGNOSIS — R300 Dysuria: Secondary | ICD-10-CM | POA: Insufficient documentation

## 2023-08-20 LAB — POCT URINALYSIS DIP (MANUAL ENTRY)
Bilirubin, UA: NEGATIVE
Blood, UA: NEGATIVE
Glucose, UA: NEGATIVE mg/dL
Ketones, POC UA: NEGATIVE mg/dL
Nitrite, UA: POSITIVE — AB
Protein Ur, POC: NEGATIVE mg/dL
Spec Grav, UA: >=1.03 — AB (ref 1.010–1.025)
Urobilinogen, UA: 0.2 U/dL
pH, UA: 5.5 (ref 5.0–8.0)

## 2023-08-20 MED ORDER — CEFDINIR 300 MG PO CAPS: 300.0000 mg | ORAL_CAPSULE | Freq: Two times a day (BID) | ORAL | 0 refills | Status: AC

## 2023-08-20 MED ORDER — PREDNISONE 20 MG PO TABS: ORAL_TABLET | ORAL | 0 refills | Status: AC

## 2023-08-20 NOTE — Discharge Instructions (Addendum)
Advised patient to take medication as directed with food to completion.  Advised patient to take prednisone with first dose of cefdinir for the next 5 of 7 days.  Encouraged increase daily water intake to 64 ounces per day while taking these medications.  Advised we will follow-up with urine culture results once received.  Advised if symptoms worsen and/or unresolved please follow-up PCP or here for further evaluation.

## 2023-08-20 NOTE — ED Triage Notes (Addendum)
Pt presents to [facility] with co of dysuria, and pelvic pressure for 2 days at home uti test positive  Pt also concerned bc she has had a cough and she has pulmonary fibrosis and is concerned about the cough.

## 2023-08-20 NOTE — ED Provider Notes (Signed)
[facility]    CSN: [id number] Arrival date & time: 08/20/23  1319      History   Chief Complaint Chief Complaint  Patient presents with   Dysuria   Cough    HPI Debbie Burke is a 57 y.o. female.   HPI pleasant 57 year old female presents with dysuria and pelvic pressure for 2 days and that home UTI test was positive.  Patient is also concerned with cough due to pulmonary fibrosis.  PMH significant for depression with anxiety and chronic gastric ulcer.  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  OB History   No obstetric history on file.      Home Medications    Prior to Admission medications   Medication Sig Start Date End Date Taking? Authorizing Provider  buPROPion (WELLBUTRIN XL) 150 MG 24 hr tablet Take 300 mg by mouth daily.  05/17/11   [provider]  cefdinir (OMNICEF) 300 MG capsule Take 1 capsule (300 mg total) by mouth 2 (two) times daily for 7 days. 08/20/23 08/27/23 Yes Trevor Iha, FNP  clonazePAM (KLONOPIN) 0.5 MG tablet Take 1 tablet (0.5 mg total) by mouth daily. 08/17/11 09/16/11  Mickeal Skinner, MD  gabapentin (NEURONTIN) 300 MG capsule Take 300 mg by mouth 4 (four) times daily. 2 tabs 4x a day    [provider]  hydrOXYzine (ATARAX/VISTARIL) 25 MG tablet Take 25 mg by mouth 3 (three) times daily as needed.    [provider]  omeprazole (PRILOSEC) 20 MG capsule Take 20 mg by mouth daily.    [provider]   oxyCODONE-acetaminophen (PERCOCET) 7.5-325 MG tablet Take 1 tablet by mouth every 6 (six) hours as needed for severe pain.    [provider]  predniSONE (DELTASONE) 20 MG tablet Take 3 tabs PO daily x 5 days. 08/20/23  Yes Trevor Iha, FNP  tiZANidine (ZANAFLEX) 4 MG capsule Take 4 mg by mouth 3 (three) times daily. 1 qam, 1 at lunch and 3 qhs    [provider]    Family History Family History  Problem Relation Age of Onset   Depression Father    Dementia Father    Bipolar disorder Daughter     Social History Social History   Tobacco Use   Smoking status: Every Day    Current packs/day: 0.50    Types: Cigarettes   Smokeless tobacco: Never   Tobacco comments:    has Chantix Rx and not using it  Vaping Use   Vaping status: Never Used  Substance Use Topics   Alcohol use: No   Drug use: No     Allergies   Patient has no known allergies.   Review of Systems Review of Systems   Physical Exam Triage Vital Signs ED Triage Vitals  Encounter Vitals Group     BP --      Systolic BP Percentile --      Diastolic BP Percentile --  Pulse Rate 08/20/23 1353 79     Resp 08/20/23 1353 16     Temp 08/20/23 1353 98 F (36.7 C)     Temp Source 08/20/23 1353 Oral     SpO2 08/20/23 1353 98 %     Weight --      Height --      Head Circumference --      Peak Flow --      Pain Score 08/20/23 1352 0     Pain Loc --      Pain Education --      Exclude from Growth Chart --    No data found.  Updated Vital Signs BP 135/82   Pulse 79   Temp 98 F (36.7 C) (Oral)   Resp 16   LMP 10/16/2010   SpO2 98%   Visual Acuity Right Eye Distance:   Left Eye Distance:   Bilateral Distance:    Right Eye Near:   Left Eye Near:    Bilateral Near:     Physical Exam Vitals and nursing note reviewed.  Constitutional:      Appearance: Normal appearance. She is normal weight.  HENT:     Head: Normocephalic and atraumatic.     Mouth/Throat:     Mouth:  Mucous membranes are moist.     Pharynx: Oropharynx is clear.  Eyes:     Extraocular Movements: Extraocular movements intact.     Conjunctiva/sclera: Conjunctivae normal.     Pupils: Pupils are equal, round, and reactive to light.  Cardiovascular:     Rate and Rhythm: Normal rate and regular rhythm.     Pulses: Normal pulses.     Heart sounds: Normal heart sounds.  Pulmonary:     Effort: Pulmonary effort is normal.     Breath sounds: Normal breath sounds. No wheezing, rhonchi or rales.  Musculoskeletal:     Cervical back: Normal range of motion and neck supple.  Skin:    General: Skin is warm and dry.  Neurological:     General: No focal deficit present.     Mental Status: She is alert and oriented to person, place, and time.  Psychiatric:        Mood and Affect: Mood normal.        Behavior: Behavior normal.      UC Treatments / Results  Labs (all labs ordered are listed, but only abnormal results are displayed) Labs Reviewed  POCT URINALYSIS DIP (MANUAL ENTRY) - Abnormal; Notable for the following components:      Result Value   Clarity, UA cloudy (*)    Spec Grav, UA >=1.030 (*)    Nitrite, UA Positive (*)    Leukocytes, UA Small (1+) (*)    All other components within normal limits  URINE CULTURE    EKG   Radiology No results found.  Procedures Procedures (including critical care time)  Medications Ordered in UC Medications - No data to display  Initial Impression / Assessment and Plan / UC Course  I have reviewed the triage vital signs and the nursing notes.  Pertinent labs & imaging results that were available during my care of the patient were reviewed by me and considered in my medical decision making (see chart for details).     MDM: 1.  Acute cystitis without hematuria-Rx'd cefdinir 300 mg capsule: Take 1 capsule by mouth 2 times daily for the next 7 days; 2.  Cough, unspecified type Rx'd cefdinir 300 mg capsule: Take 1  capsule twice daily for 7  days, Rx'd prednisone 20 mg tablet: Take 3 tabs p.o. daily x 5 days. 3.  Dysuria-UA revealed above, urine culture ordered, Rx'd cefdinir 300 mg capsule: Take 1 capsule by mouth 2 times daily for the next 7 days. Advised patient to take medication as directed with food to completion.  Advised patient to take prednisone with first dose of cefdinir for the next 5 of 7 days.  Encouraged increase daily water intake to 64 ounces per day while taking these medications.  Advised we will follow-up with urine culture results once received.  Advised if symptoms worsen and/or unresolved please follow-up PCP or here for further evaluation.  Patient discharged home, hemodynamically stable. Final Clinical Impressions(s) / UC Diagnoses   Final diagnoses:  Dysuria  Cough, unspecified type  Acute cystitis without hematuria     Discharge Instructions      Advised patient to take medication as directed with food to completion.  Advised patient to take prednisone with first dose of cefdinir for the next 5 of 7 days.  Encouraged increase daily water intake to 64 ounces per day while taking these medications.  Advised we will follow-up with urine culture results once received.  Advised if symptoms worsen and/or unresolved please follow-up PCP or here for further evaluation.     ED Prescriptions     Medication Sig Dispense Auth. Provider   cefdinir (OMNICEF) 300 MG capsule Take 1 capsule (300 mg total) by mouth 2 (two) times daily for 7 days. 14 capsule Trevor Iha, FNP   predniSONE (DELTASONE) 20 MG tablet Take 3 tabs PO daily x 5 days. 15 tablet Trevor Iha, FNP      PDMP not reviewed this encounter.   Trevor Iha, FNP 08/20/23 1429

## 2023-08-22 LAB — URINE CULTURE: Culture: >=100000 — AB

## 2023-08-28 DIAGNOSIS — R21 Rash and other nonspecific skin eruption: Secondary | ICD-10-CM | POA: Diagnosis not present

## 2023-08-28 DIAGNOSIS — J189 Pneumonia, unspecified organism: Secondary | ICD-10-CM | POA: Diagnosis not present

## 2023-08-28 DIAGNOSIS — J432 Centrilobular emphysema: Secondary | ICD-10-CM | POA: Diagnosis not present

## 2023-08-28 DIAGNOSIS — F3341 Major depressive disorder, recurrent, in partial remission: Secondary | ICD-10-CM | POA: Diagnosis not present

## 2023-08-28 DIAGNOSIS — J8410 Pulmonary fibrosis, unspecified: Secondary | ICD-10-CM | POA: Diagnosis not present

## 2023-08-28 DIAGNOSIS — Z79899 Other long term (current) drug therapy: Secondary | ICD-10-CM | POA: Diagnosis not present

## 2023-08-28 DIAGNOSIS — N179 Acute kidney failure, unspecified: Secondary | ICD-10-CM | POA: Diagnosis not present

## 2023-09-12 ENCOUNTER — Ambulatory Visit: Payer: Medicaid Other

## 2023-09-12 ENCOUNTER — Ambulatory Visit
Admission: EM | Admit: 2023-09-12 | Discharge: 2023-09-12 | Disposition: A | Discharge status: Home / Self Care | Payer: Medicaid Other | Attending: Family Medicine | Admitting: Family Medicine

## 2023-09-12 DIAGNOSIS — S86312A Strain of muscle(s) and tendon(s) of peroneal muscle group at lower leg level, left leg, initial encounter: Secondary | ICD-10-CM

## 2023-09-12 DIAGNOSIS — S99922A Unspecified injury of left foot, initial encounter: Secondary | ICD-10-CM

## 2023-09-12 DIAGNOSIS — S93402A Sprain of unspecified ligament of left ankle, initial encounter: Secondary | ICD-10-CM | POA: Diagnosis not present

## 2023-09-12 DIAGNOSIS — S93602A Unspecified sprain of left foot, initial encounter: Secondary | ICD-10-CM

## 2023-09-12 NOTE — ED Triage Notes (Signed)
Pt c/o LT foot pain since falling 2 days ago. Takes oxy prn for back pain. Elevating prn.

## 2023-09-12 NOTE — ED Provider Notes (Signed)
[facility]    CSN: [id number] Arrival date & time: 09/12/23  1608      History   Chief Complaint Chief Complaint  Patient presents with   Foot Injury    LT    HPI Debbie Burke is a 57 y.o. female.   Two days ago patient fell, inverting her left ankle resulting in left lateral ankle pain and pain in her mid-foot.  The history is provided by the patient and the spouse.  Ankle Pain Location:  Ankle and foot Time since incident:  2 days Injury: yes   Mechanism of injury: fall   Fall:    Fall occurred:  Walking   Impact surface:  Dirt Ankle location:  L ankle Foot location:  L foot Pain details:    Quality:  Aching   Radiates to:  Does not radiate   Severity:  Moderate   Onset quality:  Sudden   Duration:  2 days   Timing:  Constant   Progression:  Improving Chronicity:  New Prior injury to area:  No Relieved by:  Nothing Worsened by:  Bearing weight Ineffective treatments:  Elevation Associated symptoms: decreased ROM and swelling   Associated symptoms: no muscle weakness, no numbness and no tingling     Past Medical History:  Diagnosis Date   Anxiety    Arthralgia of multiple joints 2008   steoarthritis   Depression    Gastric ulcer, chronic November 2011   endoscopy performed  patient takes OTC  acid reducer   Headache(784.0)     Patient Active Problem List   Diagnosis Date Noted   Anxiety 08/17/2011   Depression with anxiety 08/17/2011   Bereavement 08/17/2011   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 No pertinent surgical history.  OB History   No obstetric history on file.      Home Medications    Prior to Admission medications   Medication Sig Start Date End Date Taking? Authorizing Provider  buPROPion (WELLBUTRIN XL) 150 MG 24 hr tablet Take 300 mg by mouth daily.  05/17/11   [provider]  clonazePAM (KLONOPIN)  0.5 MG tablet Take 1 tablet (0.5 mg total) by mouth daily. 08/17/11 09/16/11  Mickeal Skinner, MD  gabapentin (NEURONTIN) 300 MG capsule Take 300 mg by mouth 4 (four) times daily. 2 tabs 4x a day    [provider]  hydrOXYzine (ATARAX/VISTARIL) 25 MG tablet Take 25 mg by mouth 3 (three) times daily as needed.    [provider]  omeprazole (PRILOSEC) 20 MG capsule Take 20 mg by mouth daily.    [provider]  oxyCODONE-acetaminophen (PERCOCET) 7.5-325 MG tablet Take 1 tablet by mouth every 6 (six) hours as needed for severe pain.    [provider]  predniSONE (DELTASONE) 20 MG tablet Take 3 tabs PO daily x 5 days. 08/20/23   Trevor Iha, FNP  tiZANidine (ZANAFLEX) 4 MG capsule Take 4 mg by mouth 3 (three) times daily. 1 qam, 1 at lunch and 3 qhs    [provider]    Family History Family History  Problem Relation Age of Onset   Depression Father    Dementia Father    Bipolar disorder Daughter     Social History Social History   Tobacco Use   Smoking status: Every Day    Current packs/day: 0.50    Types: Cigarettes   Smokeless  tobacco: Never   Tobacco comments:    has Chantix Rx and not using it  Vaping Use   Vaping status: Never Used  Substance Use Topics   Alcohol use: No   Drug use: No     Allergies   Patient has no known allergies.   Review of Systems Review of Systems  Constitutional:  Positive for activity change.  Musculoskeletal:  Positive for joint swelling.  Skin:  Negative for color change and wound.  Neurological:  Negative for dizziness.  All other systems reviewed and are negative.    Physical Exam Triage Vital Signs ED Triage Vitals  Encounter Vitals Group     BP 09/12/23 1615 122/81     Systolic BP Percentile --      Diastolic BP Percentile --      Pulse Rate 09/12/23 1615 75     Resp 09/12/23 1615 16     Temp 09/12/23 1615 97.7 F (36.5 C)     Temp Source 09/12/23 1615 Oral     SpO2 09/12/23  1615 97 %     Weight --      Height --      Head Circumference --      Peak Flow --      Pain Score 09/12/23 1612 5     Pain Loc --      Pain Education --      Exclude from Growth Chart --    No data found.  Updated Vital Signs BP 122/81 (BP Location: Right Arm)   Pulse 75   Temp 97.7 F (36.5 C) (Oral)   Resp 16   LMP 10/16/2010   SpO2 97%   Visual Acuity Right Eye Distance:   Left Eye Distance:   Bilateral Distance:    Right Eye Near:   Left Eye Near:    Bilateral Near:     Physical Exam Vitals and nursing note reviewed.  Constitutional:      General: She is not in acute distress. HENT:     Head: Atraumatic.  Eyes:     Extraocular Movements: Extraocular movements intact.     Conjunctiva/sclera: Conjunctivae normal.     Pupils: Pupils are equal, round, and reactive to light.  Cardiovascular:     Rate and Rhythm: Normal rate.  Pulmonary:     Effort: Pulmonary effort is normal.  Musculoskeletal:        General: No deformity.     Left ankle: Swelling present. No deformity, ecchymosis or lacerations. Tenderness present over the lateral malleolus. Normal range of motion. Normal pulse.     Left Achilles Tendon: No tenderness.     Left foot: Normal range of motion. Tenderness present. No swelling.       Feet:     Comments: Left ankle has mild swelling and point tenderness to palpation directly over the lateral malleolus. There is tenderness over the course of the left peroneal tendon.  Pain is elicited with resisted eversion and resisted plantar flexion of the ankle.  Distal neurovascular function is intact.   Left foot has no swelling but pain is elicited when compressing the mid-foot area.  Skin:    General: Skin is warm and dry.     Findings: No bruising.  Neurological:     General: No focal deficit present.     Mental Status: She is alert.      UC Treatments / Results  Labs (all labs ordered are listed, but only abnormal results are displayed) Labs  Reviewed - No data to display  EKG   Radiology DG Ankle Complete Left  Result Date: 09/12/2023 CLINICAL DATA:  Left ankle and foot pain status post fall 2 days ago EXAM: LEFT ANKLE COMPLETE - 3+ VIEW COMPARISON:  None available FINDINGS: No fracture or dislocation. Prominent spurs noted along the dorsal aspect of the midfoot tarsal joints. Enthesopathic changes seen at the insertion of the plantar fascia on the calcaneus. IMPRESSION: No acute abnormality of the left ankle. Electronically Signed   By: Acquanetta Belling M.D.   On: 09/12/2023 17:45   DG Foot Complete Left  Result Date: 09/12/2023 CLINICAL DATA:  Trauma to the left foot. EXAM: LEFT FOOT - COMPLETE 3+ VIEW COMPARISON:  Left ankle radiograph dated 09/12/2023. FINDINGS: No acute fracture or dislocation. Mild osteopenia. There is mild degenerative changes of the hindfoot. The soft tissues are unremarkable IMPRESSION: 1. No acute fracture or dislocation. 2. Mild osteopenia. Electronically Signed   By: Elgie Collard M.D.   On: 09/12/2023 17:45    Procedures Procedures (including critical care time)  Medications Ordered in UC Medications - No data to display  Initial Impression / Assessment and Plan / UC Course  I have reviewed the triage vital signs and the nursing notes.  Pertinent labs & imaging results that were available during my care of the patient were reviewed by me and considered in my medical decision making (see chart for details).    No evidence fractures. Applied ace wrap and stirrup splint.  Dispensed crutches. Given sprain treatment instructions with range of motion and stretching exercises.  Followup with Dr. [facility] if not improving about two weeks.   Final Clinical Impressions(s) / UC Diagnoses   Final diagnoses:  Sprain of left ankle, unspecified ligament, initial encounter  Strain of peroneal tendon of left foot, initial encounter  Sprain of left foot, initial  encounter     Discharge Instructions      May apply ice pack when swelling occurs (30 minutes every 3 to 4 hours). Elevate.  Use crutches for about one week.  Wear Ace wrap until swelling decreases.  Wear brace for about 2 to 3 weeks.  Begin range of motion and stretching exercises in about 5 days as per instruction sheets.     ED Prescriptions   None       Lattie Haw, MD 09/13/23 1402

## 2023-09-12 NOTE — Discharge Instructions (Signed)
May apply ice pack when swelling occurs (30 minutes every 3 to 4 hours). Elevate.  Use crutches for about one week.  Wear Ace wrap until swelling decreases.  Wear brace for about 2 to 3 weeks.  Begin range of motion and stretching exercises in about 5 days as per instruction sheets.

## 2023-10-03 DIAGNOSIS — J988 Other specified respiratory disorders: Secondary | ICD-10-CM | POA: Diagnosis not present

## 2023-10-31 DIAGNOSIS — G4733 Obstructive sleep apnea (adult) (pediatric): Secondary | ICD-10-CM | POA: Diagnosis not present

## 2023-11-05 DIAGNOSIS — M25561 Pain in right knee: Secondary | ICD-10-CM | POA: Diagnosis not present

## 2023-11-05 DIAGNOSIS — M4726 Other spondylosis with radiculopathy, lumbar region: Secondary | ICD-10-CM | POA: Diagnosis not present

## 2023-11-05 DIAGNOSIS — M25562 Pain in left knee: Secondary | ICD-10-CM | POA: Diagnosis not present

## 2023-11-05 DIAGNOSIS — G609 Hereditary and idiopathic neuropathy, unspecified: Secondary | ICD-10-CM | POA: Diagnosis not present

## 2023-11-05 DIAGNOSIS — M797 Fibromyalgia: Secondary | ICD-10-CM | POA: Diagnosis not present

## 2023-11-05 DIAGNOSIS — M5440 Lumbago with sciatica, unspecified side: Secondary | ICD-10-CM | POA: Diagnosis not present

## 2023-11-05 DIAGNOSIS — M419 Scoliosis, unspecified: Secondary | ICD-10-CM | POA: Diagnosis not present

## 2023-11-05 DIAGNOSIS — M25552 Pain in left hip: Secondary | ICD-10-CM | POA: Diagnosis not present

## 2023-11-15 DIAGNOSIS — G4733 Obstructive sleep apnea (adult) (pediatric): Secondary | ICD-10-CM | POA: Diagnosis not present

## 2023-12-02 DIAGNOSIS — M5416 Radiculopathy, lumbar region: Secondary | ICD-10-CM | POA: Diagnosis not present

## 2023-12-02 DIAGNOSIS — E669 Obesity, unspecified: Secondary | ICD-10-CM | POA: Diagnosis not present

## 2023-12-02 DIAGNOSIS — M48061 Spinal stenosis, lumbar region without neurogenic claudication: Secondary | ICD-10-CM | POA: Diagnosis not present

## 2023-12-13 DIAGNOSIS — G4733 Obstructive sleep apnea (adult) (pediatric): Secondary | ICD-10-CM | POA: Diagnosis not present

## 2023-12-17 DIAGNOSIS — F419 Anxiety disorder, unspecified: Secondary | ICD-10-CM | POA: Diagnosis not present

## 2023-12-17 DIAGNOSIS — E66812 Obesity, class 2: Secondary | ICD-10-CM | POA: Diagnosis not present

## 2023-12-17 DIAGNOSIS — G43709 Chronic migraine without aura, not intractable, without status migrainosus: Secondary | ICD-10-CM | POA: Diagnosis not present

## 2024-01-13 DIAGNOSIS — G4733 Obstructive sleep apnea (adult) (pediatric): Secondary | ICD-10-CM | POA: Diagnosis not present

## 2024-02-04 DIAGNOSIS — M5416 Radiculopathy, lumbar region: Secondary | ICD-10-CM | POA: Diagnosis not present

## 2024-02-04 DIAGNOSIS — M25562 Pain in left knee: Secondary | ICD-10-CM | POA: Diagnosis not present

## 2024-02-04 DIAGNOSIS — M48062 Spinal stenosis, lumbar region with neurogenic claudication: Secondary | ICD-10-CM | POA: Diagnosis not present

## 2024-02-04 DIAGNOSIS — M797 Fibromyalgia: Secondary | ICD-10-CM | POA: Diagnosis not present

## 2024-02-04 DIAGNOSIS — G8929 Other chronic pain: Secondary | ICD-10-CM | POA: Diagnosis not present

## 2024-02-04 DIAGNOSIS — G609 Hereditary and idiopathic neuropathy, unspecified: Secondary | ICD-10-CM | POA: Diagnosis not present

## 2024-02-04 DIAGNOSIS — M25561 Pain in right knee: Secondary | ICD-10-CM | POA: Diagnosis not present

## 2024-02-04 DIAGNOSIS — M5440 Lumbago with sciatica, unspecified side: Secondary | ICD-10-CM | POA: Diagnosis not present
